# Patient Record
Sex: Male | Born: 2000 | Race: White | Hispanic: No | Marital: Single | Smoking: Never smoker
Health system: Southern US, Community
[De-identification: ages and names within clinical notes are randomized; demographics above are authoritative.]

## PROBLEM LIST (undated history)

## (undated) DIAGNOSIS — R569 Unspecified convulsions: Secondary | ICD-10-CM

## (undated) DIAGNOSIS — K219 Gastro-esophageal reflux disease without esophagitis: Secondary | ICD-10-CM

## (undated) DIAGNOSIS — G40909 Epilepsy, unspecified, not intractable, without status epilepticus: Secondary | ICD-10-CM

## (undated) HISTORY — PX: MYRINGOTOMY WITH TUBE PLACEMENT: SHX5663

---
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
## 2018-06-05 ENCOUNTER — Emergency Department (INDEPENDENT_AMBULATORY_CARE_PROVIDER_SITE_OTHER)
Admission: EM | Admit: 2018-06-05 | Discharge: 2018-06-05 | Disposition: A | Discharge status: Home / Self Care | Payer: Medicaid Other | Source: Home / Self Care | Attending: Emergency Medicine | Admitting: Emergency Medicine

## 2018-06-05 ENCOUNTER — Other Ambulatory Visit: Payer: Self-pay

## 2018-06-05 DIAGNOSIS — J069 Acute upper respiratory infection, unspecified: Secondary | ICD-10-CM | POA: Diagnosis not present

## 2018-06-05 DIAGNOSIS — J302 Other seasonal allergic rhinitis: Secondary | ICD-10-CM

## 2018-06-05 DIAGNOSIS — B9789 Other viral agents as the cause of diseases classified elsewhere: Secondary | ICD-10-CM | POA: Diagnosis not present

## 2018-06-05 MED ORDER — AMOXICILLIN 875 MG PO TABS: ORAL_TABLET | ORAL | 0 refills | Status: DC

## 2018-06-05 NOTE — ED Provider Notes (Signed)
[facility]    CSN: [id number] Arrival date & time: 06/05/18  1048     History   Chief Complaint Chief Complaint  Patient presents with  . Cough  . Nasal Congestion    HPI Bradley Carter is a 17 y.o. male.   HPI 5 days ago started with cold symptoms, started with runny nose and scratchy throat. Has been taking OTC Benadryl at night which helped somewhat heard some greenish yellow tint to nasal drainage. Occasional mild cough, occasional reductive of scant light yellow sputum. No chest pain or wheezing or shortness of breath. Denies sneezing or itchy watery eyes.  Has mild nasal congestion.  No chills/sweats No fever  No sinus pain/pressure No wheezing No chest congestion No hemoptysis No shortness of breath No pleuritic pain  No itchy/red eyes No earache  No nausea No vomiting No abdominal pain No diarrhea  No skin rashes + Minimal fatigue.  But he feels that is improving the past couple days No myalgias No headache No recent seizures associated with this illness  Past Medical History:  Diagnosis Date  . Epilepsy (HCC)   . Seizures [facility])     Patient Active Problem List   Diagnosis Date Noted  . ALLERGIC CONJUNCTIVITIS 11/03/2010  . ALLERGIC RHINITIS 11/03/2010    History reviewed. No pertinent surgical history.     Home Medications    Prior to Admission medications   Medication Sig Start Date End Date Taking? Authorizing Provider                fluticasone (FLONASE) 50 MCG/ACT nasal spray Place 2 sprays into both nostrils daily. 10/19/16   Lurene Shadow, PA-C  fluticasone Aleda Grana) 50 MCG/ACT nasal spray 1 or 2 sprays each nostril twice a day 11/29/17   Lajean Manes, MD  ibuprofen (ADVIL,MOTRIN) 200 MG tablet Take 200 mg by mouth every 6 (six) hours as needed.    [provider]   Family History Family History  Problem Relation Age of Onset  . Hypertension Father     Social History Social History   Tobacco Use  .  Smoking status: Never Smoker  . Smokeless tobacco: Never Used  Substance Use Topics  . Alcohol use: No  . Drug use: No     Allergies   Patient has no known allergies.   Review of Systems Review of Systems Pertinent items noted in HPI and remainder of comprehensive ROS otherwise negative.   Physical Exam Triage Vital Signs ED Triage Vitals  Enc Vitals Group     BP 06/05/18 1102 (!) 138/96     Pulse Rate 06/05/18 1102 (!) 108     Resp --      Temp 06/05/18 1102 (!) 97.5 F (36.4 C)     Temp Source 06/05/18 1102 Oral     SpO2 06/05/18 1102 99 %     Weight 06/05/18 1104 166 lb (75.3 kg)     Height 06/05/18 1104 5\' 11"  (1.803 m)     Head Circumference --      Peak Flow --      Pain Score 06/05/18 1103 0     Pain Loc --      Pain Edu? --      Excl. in GC? --    No data found.  Updated Vital Signs BP (!) 138/96 (BP Location: Right Arm)   Pulse (!) 108   Temp (!) 97.5 F (36.4 C) (Oral)   Ht 5\' 11"  (1.803 m)  Wt 75.3 kg   SpO2 99%   BMI 23.15 kg/m   Visual Acuity Right Eye Distance:   Left Eye Distance:   Bilateral Distance:    Right Eye Near:   Left Eye Near:    Bilateral Near:     Physical Exam  Constitutional: He is oriented to person, place, and time. He appears well-developed and well-nourished. No distress.  HENT:  Head: Normocephalic and atraumatic.  Right Ear: Tympanic membrane, external ear and ear canal normal.  Left Ear: Tympanic membrane, external ear and ear canal normal.  Nose: Mucosal edema and rhinorrhea present. Right sinus exhibits no maxillary sinus tenderness. Left sinus exhibits no maxillary sinus tenderness.  Mouth/Throat: Oropharynx is clear and moist. No oral lesions. No oropharyngeal exudate.  Eyes: Right eye exhibits no discharge. Left eye exhibits no discharge. No scleral icterus.  Neck: Neck supple.  Cardiovascular: Normal rate, regular rhythm and normal heart sounds.  Pulmonary/Chest: Effort normal and breath sounds normal.  He has no wheezes. He has no rales.  Lymphadenopathy:    He has no cervical adenopathy.  Neurological: He is alert and oriented to person, place, and time.  Skin: Skin is warm and dry.  Nursing note and vitals reviewed.    UC Treatments / Results  Labs (all labs ordered are listed, but only abnormal results are displayed) Labs Reviewed - No data to display  EKG None  Radiology No results found.  Procedures Procedures (including critical care time)  Medications Ordered in UC Medications - No data to display  Initial Impression / Assessment and Plan / UC Course  I have reviewed the triage vital signs and the nursing notes.  Pertinent labs & imaging results that were available during my care of the patient were reviewed by me and considered in my medical decision making (see chart for details).      Final Clinical Impressions(s) / UC Diagnoses   Final diagnoses:  Viral URI with cough  Seasonal allergies  No evidence of bacterial infection. Discussed at length with patient and father. They agree with the following plans:   Discharge Instructions     You likely have a combination of seasonal allergies and viral upper respiratory infection. If you develop bacterial symptoms, such as worsening fever or chills or worsening discolored nasal drainage and chest congestion, then fill prescription for amoxicillin. Otherwise, please read attached instruction sheets.  Push fluids. Can use OTC Flonase 1 spray each nostril twice a day. Try OTC Zyrtec, 1 daily for allergies. Follow-up with your PCP if no better 1 week, sooner if worse or new symptoms.   ED Prescriptions    Medication Sig Dispense Auth. Provider   amoxicillin (AMOXIL) 875 MG tablet Take 1 twice a day X 10 days. 20 tablet Lajean Manes, MD        Lajean Manes, MD 06/05/18 1500

## 2018-06-05 NOTE — Discharge Instructions (Addendum)
You likely have a combination of seasonal allergies and viral upper respiratory infection. If you develop bacterial symptoms, such as worsening fever or chills or worsening discolored nasal drainage and chest congestion, then fill prescription for amoxicillin. Otherwise, please read attached instruction sheets.  Push fluids. Can use OTC Flonase 1 spray each nostril twice a day. Try OTC Zyrtec, 1 daily for allergies. Follow-up with your PCP if no better 1 week, sooner if worse or new symptoms.

## 2018-06-05 NOTE — ED Triage Notes (Signed)
Pt c/o cold sxs since last Friday. Started out as runny nose and scratchy throat. Has been taking an allergy pill at night. Some greenish/yellow tint to his drainage.

## 2018-07-05 ENCOUNTER — Other Ambulatory Visit: Payer: Self-pay

## 2018-07-05 ENCOUNTER — Emergency Department (INDEPENDENT_AMBULATORY_CARE_PROVIDER_SITE_OTHER)
Admission: EM | Admit: 2018-07-05 | Discharge: 2018-07-05 | Disposition: A | Discharge status: Home / Self Care | Payer: Medicaid Other | Source: Home / Self Care | Attending: Family Medicine | Admitting: Family Medicine

## 2018-07-05 ENCOUNTER — Encounter: Payer: Self-pay | Admitting: *Deleted

## 2018-07-05 DIAGNOSIS — Z8719 Personal history of other diseases of the digestive system: Secondary | ICD-10-CM

## 2018-07-05 DIAGNOSIS — R197 Diarrhea, unspecified: Secondary | ICD-10-CM

## 2018-07-05 DIAGNOSIS — R112 Nausea with vomiting, unspecified: Secondary | ICD-10-CM | POA: Diagnosis not present

## 2018-07-05 DIAGNOSIS — F439 Reaction to severe stress, unspecified: Secondary | ICD-10-CM

## 2018-07-05 DIAGNOSIS — R109 Unspecified abdominal pain: Secondary | ICD-10-CM

## 2018-07-05 HISTORY — DX: Gastro-esophageal reflux disease without esophagitis: K21.9

## 2018-07-05 MED ORDER — ONDANSETRON 4 MG PO TBDP: 4.0000 mg | ORAL_TABLET | Freq: Three times a day (TID) | ORAL | 0 refills | Status: DC | PRN

## 2018-07-05 MED ORDER — OMEPRAZOLE 20 MG PO CPDR: 20.0000 mg | DELAYED_RELEASE_CAPSULE | Freq: Every day | ORAL | 0 refills | Status: DC

## 2018-07-05 MED ORDER — CETIRIZINE HCL 10 MG PO TABS: 10.0000 mg | ORAL_TABLET | Freq: Every day | ORAL | 1 refills | Status: AC

## 2018-07-05 NOTE — ED Provider Notes (Addendum)
[name]    CSN: [id number] Arrival date & time: 07/05/18  0948     History   Chief Complaint Chief Complaint  Patient presents with  . Diarrhea  . Emesis    HPI Benn Tarver is a 17 y.o. male.   HPI Rahmel Nedved is a 17 y.o. male presenting to UC with mother c/o 3 days of umbilical and epigastric abdominal cramping, associated 2 episodes of vomiting the first day, nausea, and two episodes of diarrhea over the last 24 hours. Pt believes it could be due to stress at home and school.  Mother agrees as pt's grandmother is in the hospital but she also notes pt had a hx of severe GERD when he was born.  Pt has had decreased appetite due to the nausea. Denies fever, chills, or urinary symptoms. No hx of abdominal surgeries.  No sick contacts or recent travel.    Past Medical History:  Diagnosis Date  . Epilepsy (HCC)   . GERD (gastroesophageal reflux disease)    as an infant and child  . Seizures (HCC)    as child- resolved per pt's mother    Patient Active Problem List   Diagnosis Date Noted  . ALLERGIC CONJUNCTIVITIS 11/03/2010  . ALLERGIC RHINITIS 11/03/2010    Past Surgical History:  Procedure Laterality Date  . MYRINGOTOMY WITH TUBE PLACEMENT         Home Medications    Prior to Admission medications   Medication Sig Start Date End Date Taking? Authorizing Provider  cetirizine (ZYRTEC) 10 MG tablet Take 1 tablet (10 mg total) by mouth daily. 07/05/18   Lurene Shadow, PA-C  omeprazole (PRILOSEC) 20 MG capsule Take 1 capsule (20 mg total) by mouth daily. 07/05/18   Lurene Shadow, PA-C  ondansetron (ZOFRAN ODT) 4 MG disintegrating tablet Take 1 tablet (4 mg total) by mouth every 8 (eight) hours as needed. 07/05/18   Lurene Shadow, PA-C    Family History Family History  Problem Relation Age of Onset  . Hypertension Father     Social History Social History   Tobacco Use  . Smoking status: Never Smoker  . Smokeless tobacco: Never Used    Substance Use Topics  . Alcohol use: No  . Drug use: No     Allergies   Patient has no known allergies.   Review of Systems Review of Systems  Constitutional: Negative for chills and fever.  Cardiovascular: Negative for chest pain and palpitations.  Gastrointestinal: Positive for abdominal pain, diarrhea, nausea and vomiting. Negative for blood in stool.  Genitourinary: Negative for dysuria, frequency and hematuria.  Musculoskeletal: Negative for back pain and myalgias.  Neurological: Negative for dizziness, light-headedness and headaches.     Physical Exam Triage Vital Signs ED Triage Vitals  Enc Vitals Group     BP 07/05/18 1007 135/89     Pulse Rate 07/05/18 1007 (!) 102     Resp 07/05/18 1007 18     Temp 07/05/18 1007 (!) 97.3 F (36.3 C)     Temp src --      SpO2 07/05/18 1007 97 %     Weight 07/05/18 1008 161 lb (73 kg)     Height 07/05/18 1008 5\' 11"  (1.803 m)     Head Circumference --      Peak Flow --      Pain Score 07/05/18 1007 3     Pain Loc --      Pain Edu? --  Excl. in GC? --    No data found.  Updated Vital Signs BP 135/89 (BP Location: Right Arm)   Pulse (!) 102   Temp (!) 97.3 F (36.3 C)   Resp 18   Ht 5\' 11"  (1.803 m)   Wt 161 lb (73 kg)   SpO2 97%   BMI 22.45 kg/m   Visual Acuity Right Eye Distance:   Left Eye Distance:   Bilateral Distance:    Right Eye Near:   Left Eye Near:    Bilateral Near:     Physical Exam  Constitutional: He is oriented to person, place, and time. He appears well-developed and well-nourished. No distress.  HENT:  Head: Normocephalic and atraumatic.  Right Ear: Tympanic membrane normal.  Left Ear: Tympanic membrane normal.  Nose: Nose normal.  Mouth/Throat: Uvula is midline, oropharynx is clear and moist and mucous membranes are normal.  Eyes: EOM are normal.  Neck: Normal range of motion. Neck supple.  Cardiovascular: Normal rate and regular rhythm.  Pulmonary/Chest: Effort normal and  breath sounds normal. No stridor. No respiratory distress. He has no wheezes. He has no rales.  Abdominal: Soft. Bowel sounds are normal. There is tenderness in the epigastric area and periumbilical area. There is no rigidity, no guarding and no CVA tenderness.  Musculoskeletal: Normal range of motion.  Neurological: He is alert and oriented to person, place, and time.  Skin: Skin is warm and dry. He is not diaphoretic.  Psychiatric: He has a normal mood and affect. His behavior is normal.  Nursing note and vitals reviewed.    UC Treatments / Results  Labs (all labs ordered are listed, but only abnormal results are displayed) Labs Reviewed - No data to display  EKG None  Radiology No results found.  Procedures Procedures (including critical care time)  Medications Ordered in UC Medications - No data to display  Initial Impression / Assessment and Plan / UC Course  I have reviewed the triage vital signs and the nursing notes.  Pertinent labs & imaging results that were available during my care of the patient were reviewed by me and considered in my medical decision making (see chart for details).     No evidence of acute abdomen. Doubt appendicitis or cholecystitis at this time. Likely viral vs stress/GERD Encouraged conservative tx at this time. Home instructions provided. Pt and mother agreeable with tx plan.  Mother also requested refill on Zyrtec for pt's hx of seasonal allergies as it is cheaper prescription for pt than OTC.  Final Clinical Impressions(s) / UC Diagnoses   Final diagnoses:  Nausea vomiting and diarrhea  Stress  Abdominal cramping  Hx of gastroesophageal reflux (GERD)     Discharge Instructions      Be sure to get a lot of rest and stay well hydrated with sports drinks, water, diluted juices, and clear sodas.  Avoid fried fatty food, spicy food, and milk as these foods can cause worsening stomach upset.   Please follow up with family  medicine early next week if not improving.     ED Prescriptions    Medication Sig Dispense Auth. Provider   ondansetron (ZOFRAN ODT) 4 MG disintegrating tablet Take 1 tablet (4 mg total) by mouth every 8 (eight) hours as needed. 12 tablet Doroteo Glassman, Ryver Poblete O, PA-C   cetirizine (ZYRTEC) 10 MG tablet Take 1 tablet (10 mg total) by mouth daily. 30 tablet Lurene Shadow, PA-C   omeprazole (PRILOSEC) 20 MG capsule Take 1 capsule (20  mg total) by mouth daily. 30 capsule Lurene ShadowPhelps, Aarianna Hoadley O, PA-C     Controlled Substance Prescriptions [place] Controlled Substance Registry consulted? Not Applicable   Rolla Platehelps, Lyndel Dancel O, PA-C 07/05/18 1542    Lurene ShadowPhelps, Lief Palmatier O, [place]PA-C 07/05/18 [phone]

## 2018-07-05 NOTE — ED Triage Notes (Signed)
Pt c/o diarrhea x 2, vomiting x 1 and nausea x 3 days with umbilical area pain. Denies fever. He has taken Pepto pills. His mother reports a hx of GERD since birth, but no issues since childhood.

## 2018-07-05 NOTE — Discharge Instructions (Signed)
°  Be sure to get a lot of rest and stay well hydrated with sports drinks, water, diluted juices, and clear sodas.  Avoid fried fatty food, spicy food, and milk as these foods can cause worsening stomach upset.   Please follow up with family medicine early next week if not improving.

## 2018-07-07 ENCOUNTER — Telehealth: Payer: Self-pay | Admitting: Emergency Medicine

## 2018-07-07 NOTE — Telephone Encounter (Signed)
Attempted to contact patient, mailbox is full unable to leave a message.

## 2020-08-21 HISTORY — PX: WISDOM TOOTH EXTRACTION: SHX21

## 2020-08-23 ENCOUNTER — Encounter: Payer: Self-pay | Admitting: Emergency Medicine

## 2020-08-23 ENCOUNTER — Emergency Department (INDEPENDENT_AMBULATORY_CARE_PROVIDER_SITE_OTHER)
Admission: EM | Admit: 2020-08-23 | Discharge: 2020-08-23 | Disposition: A | Discharge status: Home / Self Care | Payer: Medicaid Other | Source: Home / Self Care

## 2020-08-23 ENCOUNTER — Other Ambulatory Visit: Payer: Self-pay

## 2020-08-23 DIAGNOSIS — H6692 Otitis media, unspecified, left ear: Secondary | ICD-10-CM | POA: Diagnosis not present

## 2020-08-23 DIAGNOSIS — Z20822 Contact with and (suspected) exposure to covid-19: Secondary | ICD-10-CM

## 2020-08-23 DIAGNOSIS — J069 Acute upper respiratory infection, unspecified: Secondary | ICD-10-CM

## 2020-08-23 MED ORDER — AMOXICILLIN-POT CLAVULANATE 875-125 MG PO TABS: 1.0000 | ORAL_TABLET | Freq: Two times a day (BID) | ORAL | 0 refills | Status: DC

## 2020-08-23 NOTE — Discharge Instructions (Addendum)

## 2020-08-23 NOTE — ED Provider Notes (Signed)
[facility]    CSN: [id number] Arrival date & time: 08/23/20  1627      History   Chief Complaint Chief Complaint  Patient presents with  . Nasal Congestion    HPI Bradley Carter is a 20 y.o. male.   HPI Bradley Carter is a 20 y.o. male presenting to UC with c/o nasal congestion, scratchy throat and minimal cough that started about 6 days ago, after being exposed to someone positive with COVID a week ago. Associated bilateral ear fullness.  Denies fever, chills, n/v/d. He has had 2 doses of Pifzer COVID vaccine but not the booster. He has not had the flu vaccine.    Past Medical History:  Diagnosis Date  . Epilepsy ([place])   . GERD (gastroesophageal reflux disease)    as an infant and child  . Seizures ([place])    as child- resolved per pt's mother    Patient Active Problem List   Diagnosis Date Noted  . ALLERGIC CONJUNCTIVITIS 11/03/2010  . ALLERGIC RHINITIS 11/03/2010    Past Surgical History:  Procedure Laterality Date  . MYRINGOTOMY WITH TUBE PLACEMENT         Home Medications    Prior to Admission medications   Medication Sig Start Date End Date Taking? Authorizing Provider  amoxicillin-clavulanate (AUGMENTIN) 875-125 MG tablet Take 1 tablet by mouth 2 (two) times daily. One po bid x 7 days 08/23/20  Yes Nancy Manuele O, PA-C  cetirizine (ZYRTEC) 10 MG tablet Take 1 tablet (10 mg total) by mouth daily. 07/05/18   Noe Gens, PA-C  omeprazole (PRILOSEC) 20 MG capsule Take 1 capsule (20 mg total) by mouth daily. 07/05/18   Noe Gens, PA-C  ondansetron (ZOFRAN ODT) 4 MG disintegrating tablet Take 1 tablet (4 mg total) by mouth every 8 (eight) hours as needed. 07/05/18   Noe Gens, PA-C    Family History Family History  Problem Relation Age of Onset  . Hypertension Father     Social History Social History   Tobacco Use  . Smoking status: Never Smoker  . Smokeless tobacco: Never Used  Vaping Use  . Vaping Use: Never used  Substance  Use Topics  . Alcohol use: No  . Drug use: No     Allergies   Patient has no known allergies.   Review of Systems Review of Systems  Constitutional: Negative for chills and fever.  HENT: Positive for congestion and sore throat. Negative for ear pain, trouble swallowing and voice change.   Respiratory: Positive for cough. Negative for shortness of breath.   Cardiovascular: Negative for chest pain and palpitations.  Gastrointestinal: Negative for abdominal pain, diarrhea, nausea and vomiting.  Musculoskeletal: Negative for arthralgias, back pain and myalgias.  Skin: Negative for rash.  All other systems reviewed and are negative.    Physical Exam Triage Vital Signs ED Triage Vitals  Enc Vitals Group     BP 08/23/20 1729 129/87     Pulse Rate 08/23/20 1729 74     Resp 08/23/20 1729 16     Temp 08/23/20 1729 98.5 F (36.9 C)     Temp Source 08/23/20 1729 Oral     SpO2 08/23/20 1729 100 %     Weight 08/23/20 1731 175 lb (79.4 kg)     Height 08/23/20 1731 5\' 11"  (1.803 m)     Head Circumference --      Peak Flow --      Pain Score 08/23/20 1730 0  Pain Loc --      Pain Edu? --      Excl. in GC? --    No data found.  Updated Vital Signs BP 129/87 (BP Location: Right Arm)   Pulse 74   Temp 98.5 F (36.9 C) (Oral)   Resp 16   Ht 5\' 11"  (1.803 m)   Wt 175 lb (79.4 kg)   SpO2 100%   BMI 24.41 kg/m   Visual Acuity Right Eye Distance:   Left Eye Distance:   Bilateral Distance:    Right Eye Near:   Left Eye Near:    Bilateral Near:     Physical Exam Vitals and nursing note reviewed.  Constitutional:      General: He is not in acute distress.    Appearance: Normal appearance. He is well-developed and well-nourished. He is not ill-appearing, toxic-appearing or diaphoretic.  HENT:     Head: Normocephalic and atraumatic.     Right Ear: Tympanic membrane and ear canal normal.     Left Ear: Ear canal normal. A middle ear effusion is present. Tympanic membrane  is erythematous and bulging.     Nose: Congestion present.     Right Sinus: No maxillary sinus tenderness or frontal sinus tenderness.     Left Sinus: No maxillary sinus tenderness or frontal sinus tenderness.     Mouth/Throat:     Lips: Pink.     Mouth: Mucous membranes are moist.     Pharynx: Oropharynx is clear. Uvula midline. No pharyngeal swelling, oropharyngeal exudate, posterior oropharyngeal erythema or uvula swelling.  Eyes:     Extraocular Movements: EOM normal.  Cardiovascular:     Rate and Rhythm: Normal rate and regular rhythm.  Pulmonary:     Effort: Pulmonary effort is normal. No respiratory distress.     Breath sounds: Normal breath sounds. No stridor. No wheezing, rhonchi or rales.  Musculoskeletal:        General: Normal range of motion.     Cervical back: Normal range of motion and neck supple. No tenderness.  Lymphadenopathy:     Cervical: Cervical adenopathy present.  Skin:    General: Skin is warm and dry.  Neurological:     Mental Status: He is alert and oriented to person, place, and time.  Psychiatric:        Mood and Affect: Mood and affect normal.        Behavior: Behavior normal.      UC Treatments / Results  Labs (all labs ordered are listed, but only abnormal results are displayed) Labs Reviewed  COVID-19, FLU A+B NAA    EKG   Radiology No results found.  Procedures Procedures (including critical care time)  Medications Ordered in UC Medications - No data to display  Initial Impression / Assessment and Plan / UC Course  I have reviewed the triage vital signs and the nursing notes.  Pertinent labs & imaging results that were available during my care of the patient were reviewed by me and considered in my medical decision making (see chart for details).     Hx and exam c/w Left AOM secondary to URI COVID/Flu test pending Rx: augmenting F/u with PCP as needed  Final Clinical Impressions(s) / UC Diagnoses   Final diagnoses:   Acute upper respiratory infection  Exposure to COVID-19 virus  Left acute otitis media     Discharge Instructions      Please take antibiotics as prescribed and be sure to complete entire course  even if you start to feel better to ensure infection does not come back.   You may take 500mg  acetaminophen every 4-6 hours or in combination with ibuprofen 400-600mg  every 6-8 hours as needed for pain, inflammation, and fever.  Be sure to well hydrated with clear liquids and get at least 8 hours of sleep at night, preferably more while sick.   Please follow up with family medicine in 1 week if needed.     ED Prescriptions    Medication Sig Dispense Auth. Provider   amoxicillin-clavulanate (AUGMENTIN) 875-125 MG tablet Take 1 tablet by mouth 2 (two) times daily. One po bid x 7 days 14 tablet , Lurene Shadow     PDMP not reviewed this encounter.   [place], Lurene Shadow 08/23/20 1910

## 2020-08-23 NOTE — ED Triage Notes (Signed)
Patient here day 6 after exposure to covid positive person; has had some congestion and takes zyrtec. Had first 2 doses Pfizer vaccine; not booster yet; no influenza vaccination. Last OTC around noon today.

## 2020-08-25 LAB — COVID-19, FLU A+B NAA
Influenza A, NAA: NOT DETECTED
Influenza B, NAA: NOT DETECTED
SARS-CoV-2, NAA: NOT DETECTED

## 2020-08-26 ENCOUNTER — Telehealth: Payer: Self-pay

## 2020-08-26 NOTE — Telephone Encounter (Signed)
Negative flu and covid test results discussed with patient. Questions asked and answers provided.

## 2021-07-08 ENCOUNTER — Other Ambulatory Visit: Payer: Self-pay

## 2021-07-08 ENCOUNTER — Emergency Department (INDEPENDENT_AMBULATORY_CARE_PROVIDER_SITE_OTHER)
Admission: EM | Admit: 2021-07-08 | Discharge: 2021-07-08 | Disposition: A | Discharge status: Home / Self Care | Payer: Medicaid Other | Source: Home / Self Care

## 2021-07-08 DIAGNOSIS — J0100 Acute maxillary sinusitis, unspecified: Secondary | ICD-10-CM

## 2021-07-08 DIAGNOSIS — J309 Allergic rhinitis, unspecified: Secondary | ICD-10-CM

## 2021-07-08 DIAGNOSIS — R52 Pain, unspecified: Secondary | ICD-10-CM

## 2021-07-08 MED ORDER — CEFDINIR 300 MG PO CAPS
300.0000 mg | ORAL_CAPSULE | Freq: Two times a day (BID) | ORAL | 0 refills | Status: AC
Start: 2021-07-08 — End: 2021-07-15

## 2021-07-08 MED ORDER — FEXOFENADINE HCL 180 MG PO TABS: 180.0000 mg | ORAL_TABLET | Freq: Every day | ORAL | 0 refills | Status: AC

## 2021-07-08 NOTE — ED Triage Notes (Signed)
Pt states that he has some vomiting, body aches and headache. X1week  Pt states that he is vaccinated.  Pt states that he hasn't had flu vaccine.

## 2021-07-08 NOTE — Discharge Instructions (Addendum)
Advised/instructed patient to take medication as directed with food to completion.  Advised patient to discontinue Zyrtec and take Allegra with first dose of antibiotic for the next 5 of 7 days.  Advised may use Allegra afterwards as needed for concurrent postnasal drainage/drip.  Advised patient we will follow-up with COVID-19 flu A/B results once received.  Encouraged patient to increase daily water intake while taking these medications.

## 2021-07-08 NOTE — ED Provider Notes (Signed)
[name]    CSN: [id number] Arrival date & time: 07/08/21  1316      History   Chief Complaint Chief Complaint  Patient presents with   Vomiting    Pt states that he has some vomiting, body aches, and headache. X1 week    HPI Lorrie Strauch is a 20 y.o. male.   HPI 20 year old male presents with body aches, some vomiting and headache for 1 week.  Past Medical History:  Diagnosis Date   Epilepsy ([facility])    GERD (gastroesophageal reflux disease)    as an infant and child   Seizures ([facility])    as child- resolved per pt's mother    Patient Active Problem List   Diagnosis Date Noted   ALLERGIC CONJUNCTIVITIS 11/03/2010   ALLERGIC RHINITIS 11/03/2010    Past Surgical History:  Procedure Laterality Date   MYRINGOTOMY WITH TUBE PLACEMENT         Home Medications    Prior to Admission medications   Medication Sig Start Date End Date Taking? Authorizing Provider  amoxicillin-clavulanate (AUGMENTIN) 875-125 MG tablet Take 1 tablet by mouth 2 (two) times daily. One po bid x 7 days 08/23/20   Lurene Shadow, PA-C  cetirizine (ZYRTEC) 10 MG tablet Take 1 tablet (10 mg total) by mouth daily. 07/05/18   Lurene Shadow, PA-C  omeprazole (PRILOSEC) 20 MG capsule Take 1 capsule (20 mg total) by mouth daily. 07/05/18   Lurene Shadow, PA-C  ondansetron (ZOFRAN ODT) 4 MG disintegrating tablet Take 1 tablet (4 mg total) by mouth every 8 (eight) hours as needed. 07/05/18   Lurene Shadow, PA-C    Family History Family History  Problem Relation Age of Onset   Hypertension Father     Social History Social History   Tobacco Use   Smoking status: Never   Smokeless tobacco: Never  Vaping Use   Vaping Use: Never used  Substance Use Topics   Alcohol use: No   Drug use: No     Allergies   Patient has no known allergies.   Review of Systems Review of Systems  Constitutional:  Positive for fatigue and fever.  Gastrointestinal:  Positive for vomiting.   Musculoskeletal:  Positive for myalgias.  Neurological:  Positive for headaches.  All other systems reviewed and are negative.   Physical Exam Triage Vital Signs ED Triage Vitals  Enc Vitals Group     BP 07/08/21 1353 129/84     Pulse Rate 07/08/21 1353 (!) 107     Resp 07/08/21 1353 20     Temp 07/08/21 1353 (!) 97.5 F (36.4 C)     Temp Source 07/08/21 1353 Oral     SpO2 07/08/21 1353 96 %     Weight 07/08/21 1351 180 lb (81.6 kg)     Height 07/08/21 1351 5\' 11"  (1.803 m)     Head Circumference --      Peak Flow --      Pain Score 07/08/21 1350 7     Pain Loc --      Pain Edu? --      Excl. in GC? --    No data found.  Updated Vital Signs BP 129/84 (BP Location: Right Arm)   Pulse (!) 107   Temp (!) 97.5 F (36.4 C) (Oral)   Resp 20   Ht 5\' 11"  (1.803 m)   Wt 180 lb (81.6 kg)   SpO2 96%   BMI 25.10 kg/m  Physical Exam Vitals and nursing note reviewed.  Constitutional:      General: He is not in acute distress.    Appearance: Normal appearance. He is normal weight. He is not ill-appearing.  HENT:     Head: Normocephalic and atraumatic.     Right Ear: Tympanic membrane, ear canal and external ear normal.     Left Ear: Tympanic membrane, ear canal and external ear normal.     Nose: Nose normal.     Mouth/Throat:     Mouth: Mucous membranes are moist.     Pharynx: Oropharynx is clear.  Eyes:     Extraocular Movements: Extraocular movements intact.     Conjunctiva/sclera: Conjunctivae normal.     Pupils: Pupils are equal, round, and reactive to light.  Cardiovascular:     Rate and Rhythm: Normal rate and regular rhythm.     Pulses: Normal pulses.     Heart sounds: Normal heart sounds.  Pulmonary:     Effort: Pulmonary effort is normal.     Breath sounds: Normal breath sounds.  Musculoskeletal:        General: Normal range of motion.     Cervical back: Normal range of motion and neck supple.  Skin:    General: Skin is warm and dry.  Neurological:      General: No focal deficit present.     Mental Status: He is alert and oriented to person, place, and time.     UC Treatments / Results  Labs (all labs ordered are listed, but only abnormal results are displayed) Labs Reviewed  COVID-19, FLU A+B NAA    EKG   Radiology No results found.  Procedures Procedures (including critical care time)  Medications Ordered in UC Medications - No data to display  Initial Impression / Assessment and Plan / UC Course  I have reviewed the triage vital signs and the nursing notes.  Pertinent labs & imaging results that were available during my care of the patient were reviewed by me and considered in my medical decision making (see chart for details).     MDM: 1. Subacute maxillary sinusitis-Rx'd Cefdinir; 2.  Allergic rhinitis-Rx'd Allegra; 3.  Generalized body aches-COVID-19 flu AB ordered. Advised/instructed patient to take medication as directed with food to completion.  Advised patient to discontinue Zyrtec and take Allegra with first dose of antibiotic for the next 5 of 7 days.  Advised may use Allegra afterwards as needed for concurrent postnasal drainage/drip.  Advised patient we will follow-up with COVID-19 flu A/B results once received.  Encouraged patient to increase daily water intake while taking these medications.  Patient discharged home, hemodynamically stable. Final Clinical Impressions(s) / UC Diagnoses   Final diagnoses:  Subacute maxillary sinusitis  Allergic rhinitis, unspecified seasonality, unspecified trigger  Generalized body aches     Discharge Instructions      Advised/instructed patient to take medication as directed with food to completion.  Advised patient to discontinue Zyrtec and take Allegra with first dose of antibiotic for the next 5 of 7 days.  Advised may use Allegra afterwards as needed for concurrent postnasal drainage/drip.  Advised patient we will follow-up with COVID-19 flu A/B results once  received.  Encouraged patient to increase daily water intake while taking these medications.     ED Prescriptions   None    PDMP not reviewed this encounter.   Trevor Iha, FNP 07/08/21 1513

## 2021-07-09 LAB — COVID-19, FLU A+B NAA
Influenza A, NAA: NOT DETECTED
Influenza B, NAA: NOT DETECTED
SARS-CoV-2, NAA: NOT DETECTED

## 2021-12-17 ENCOUNTER — Encounter: Payer: Self-pay | Admitting: Emergency Medicine

## 2021-12-17 ENCOUNTER — Emergency Department
Admission: EM | Admit: 2021-12-17 | Discharge: 2021-12-17 | Disposition: A | Discharge status: Home / Self Care | Payer: Medicaid Other | Source: Home / Self Care | Attending: Family Medicine | Admitting: Family Medicine

## 2021-12-17 DIAGNOSIS — J029 Acute pharyngitis, unspecified: Secondary | ICD-10-CM

## 2021-12-17 DIAGNOSIS — K140 Glossitis: Secondary | ICD-10-CM

## 2021-12-17 LAB — POCT RAPID STREP A (OFFICE): Rapid Strep A Screen: NEGATIVE

## 2021-12-17 LAB — POC SARS CORONAVIRUS 2 AG -  ED: SARS Coronavirus 2 Ag: NEGATIVE

## 2021-12-17 MED ORDER — ACETAMINOPHEN 325 MG PO TABS
650.0000 mg | ORAL_TABLET | Freq: Once | ORAL | Status: AC
  Administered 2021-12-17: 650 mg via ORAL

## 2021-12-17 NOTE — ED Provider Notes (Signed)
?[facility] ? ? ? ?CSN: [id number] ?Arrival date & time: 12/17/21  1326 ? ? ?  ? ?History   ?Chief Complaint ?Chief Complaint  ?Patient presents with  ? Sore Throat  ? ? ?HPI ?Bradley Carter is a 21 y.o. male.  ? ?HPI ? ?Patient's had some muscle pain in his back since Tuesday.  He points to the area of his shoulder blade, mid back region.  He did not have any accident or injury, no overuse, he thinks he "slept wrong".  This morning he woke up with a red irritated tongue, sore throat, and fever.  No known exposure to COVID or influenza, or strep.  He is here for evaluation. ?He is generally in good health.  On no medications.  Had seizures as a child but none since he is an adult ?He does have environmental allergies and takes as needed over-the-counter medicine ? ?Past Medical History:  ?Diagnosis Date  ? Epilepsy ([facility])   ? GERD (gastroesophageal reflux disease)   ? as an infant and child  ? Seizures ([facility])   ? as child- resolved per pt's mother  ? ? ?Patient Active Problem List  ? Diagnosis Date Noted  ? ALLERGIC CONJUNCTIVITIS 11/03/2010  ? ALLERGIC RHINITIS 11/03/2010  ? ? ?Past Surgical History:  ?Procedure Laterality Date  ? MYRINGOTOMY WITH TUBE PLACEMENT    ? WISDOM TOOTH EXTRACTION  2022  ? ? ? ? ? ?Home Medications   ? ?Prior to Admission medications   ?Medication Sig Start Date End Date Taking? Authorizing Provider  ?cetirizine (ZYRTEC) 10 MG tablet Take 1 tablet (10 mg total) by mouth daily. 07/05/18   Lurene ShadowPhelps, Erin O, PA-C  ?fexofenadine (ALLEGRA ALLERGY) 180 MG tablet Take 1 tablet (180 mg total) by mouth daily for 15 days. 07/08/21 07/23/21  Trevor Ihaagan, Michael, FNP  ? ? ?Family History ?Family History  ?Problem Relation Age of Onset  ? Healthy Mother   ? Hypertension Father   ? ? ?Social History ?Social History  ? ?Tobacco Use  ? Smoking status: Never  ? Smokeless tobacco: Never  ?Vaping Use  ? Vaping Use: Never used  ?Substance Use Topics  ? Alcohol use: No  ? Drug use: No  ? ? ? ?Allergies    ?Penicillins ? ? ?Review of Systems ?Review of Systems ?See HPI ? ?Physical Exam ?Triage Vital Signs ?ED Triage Vitals  ?Enc Vitals Group  ?   BP 12/17/21 1343 128/80  ?   Pulse Rate 12/17/21 1343 (!) 120  ?   Resp 12/17/21 1343 17  ?   Temp 12/17/21 1343 (!) 101.4 ?F (38.6 ?C)  ?   Temp Source 12/17/21 1343 Oral  ?   SpO2 12/17/21 1343 97 %  ?   Weight 12/17/21 1345 175 lb (79.4 kg)  ?   Height 12/17/21 1345 5\' 11"  (1.803 m)  ?   Head Circumference --   ?   Peak Flow --   ?   Pain Score 12/17/21 1344 6  ?   Pain Loc --   ?   Pain Edu? --   ?   Excl. in GC? --   ? ?No data found. ? ?Updated Vital Signs ?BP 128/80 (BP Location: Left Arm)   Pulse (!) 120   Temp (!) 101 ?F (38.3 ?C) (Oral)   Resp 17   Ht 5\' 11"  (1.803 m)   Wt 79.4 kg   SpO2 97%   BMI 24.41 kg/m?  ? ? ?Physical Exam ?Constitutional:   ?  General: He is not in acute distress. ?   Appearance: He is well-developed and normal weight. He is ill-appearing.  ?HENT:  ?   Head: Normocephalic and atraumatic.  ?   Right Ear: Tympanic membrane and ear canal normal.  ?   Left Ear: Tympanic membrane and ear canal normal.  ?   Nose: No congestion.  ?   Mouth/Throat:  ?   Mouth: Mucous membranes are moist. Oral lesions present.  ?   Pharynx: Posterior oropharyngeal erythema present.  ?   Tonsils: No tonsillar exudate. 0 on the right. 0 on the left.  ?   Comments: Patient has petechiae all around the rim of his tongue and slight erythema of the tongue body.  Posterior pharynx erythematous.  No exudate ?Eyes:  ?   Conjunctiva/sclera: Conjunctivae normal.  ?   Pupils: Pupils are equal, round, and reactive to light.  ?Cardiovascular:  ?   Rate and Rhythm: Normal rate and regular rhythm.  ?   Heart sounds: Normal heart sounds.  ?Pulmonary:  ?   Effort: Pulmonary effort is normal. No respiratory distress.  ?   Breath sounds: Normal breath sounds. No wheezing or rhonchi.  ?Abdominal:  ?   General: There is no distension.  ?   Palpations: Abdomen is soft.   ?Musculoskeletal:     ?   General: Normal range of motion.  ?   Cervical back: Normal range of motion.  ?Lymphadenopathy:  ?   Cervical: No cervical adenopathy.  ?Skin: ?   General: Skin is warm and dry.  ?   Findings: No rash.  ?Neurological:  ?   General: No focal deficit present.  ?   Mental Status: He is alert.  ? ? ? ?UC Treatments / Results  ?Labs ?(all labs ordered are listed, but only abnormal results are displayed) ?Labs Reviewed  ?CULTURE, GROUP A STREP  ?POCT RAPID STREP A (OFFICE)  ?POC SARS CORONAVIRUS 2 AG -  ED  ? ? ?EKG ? ? ?Radiology ?No results found. ? ?Procedures ?Procedures (including critical care time) ? ?Medications Ordered in UC ?Medications  ?acetaminophen (TYLENOL) tablet 650 mg (650 mg Oral Given 12/17/21 1355)  ? ? ?Initial Impression / Assessment and Plan / UC Course  ?I have reviewed the triage vital signs and the nursing notes. ? ?Pertinent labs & imaging results that were available during my care of the patient were reviewed by me and considered in my medical decision making (see chart for details). ? ?  ? ?Rapid strep test is negative ?Rapid COVID test ?Throat culture is sent ? ?Penicillin allergy discussed.  He was given penicillin and 800 mg ibuprofen after his wisdom teeth removed and had a recurrence of seizures.  His neurologist told him not to take this medicine again.  No true penicillin allergy documented. ?Final Clinical Impressions(s) / UC Diagnoses  ? ?Final diagnoses:  ?Acute pharyngitis, unspecified etiology  ?Viral pharyngitis  ?Acute glossitis  ? ? ? ?Discharge Instructions   ? ?  ?Strep rapid test is negative, throat culture will be sent to laboratory.  These results will be available in 2 to 3 days on MyChart. ?COVID test is negative ?This is a viral respiratory infection.  You need rest, lots of fluids, Tylenol or ibuprofen for pain and fever ?May return to work and school when you have had no fever for 24 hours and your symptoms have improved ?Call for  problems ? ? ? ? ?ED Prescriptions   ?None ?  ? ?  PDMP not reviewed this encounter. ?  ?Eustace Moore, MD ?12/17/21 1455 ? ?

## 2021-12-17 NOTE — ED Triage Notes (Signed)
Pt has had upper middle back pain since Tuesday  ?Denies sore throat  ?Woke up this am w/ a red, irritated tongue & sore throat ?OTC meds - Flexeril x 1 last night ?OTC - Ibuprofen during the week  ?Heating pad  ?Warm bath  ?

## 2021-12-17 NOTE — Discharge Instructions (Signed)
Strep rapid test is negative, throat culture will be sent to laboratory.  These results will be available in 2 to 3 days on MyChart. ?COVID test is negative ?This is a viral respiratory infection.  You need rest, lots of fluids, Tylenol or ibuprofen for pain and fever ?May return to work and school when you have had no fever for 24 hours and your symptoms have improved ?Call for problems ?

## 2021-12-21 LAB — CULTURE, GROUP A STREP: Strep A Culture: NEGATIVE

## 2022-08-25 ENCOUNTER — Ambulatory Visit
Admission: EM | Admit: 2022-08-25 | Discharge: 2022-08-25 | Disposition: A | Discharge status: Home / Self Care | Payer: Medicaid Other | Attending: Family Medicine | Admitting: Family Medicine

## 2022-08-25 DIAGNOSIS — J209 Acute bronchitis, unspecified: Secondary | ICD-10-CM | POA: Diagnosis not present

## 2022-08-25 MED ORDER — DOXYCYCLINE HYCLATE 100 MG PO CAPS: ORAL_CAPSULE | ORAL | 0 refills | Status: AC

## 2022-08-25 NOTE — Discharge Instructions (Signed)
Take plain guaifenesin (1200mg  extended release tabs such as Mucinex) twice daily, with plenty of water, for cough and congestion. Get adequate rest.   May take Delsym Cough Suppressant ("12 Hour Cough Relief") at bedtime for nighttime cough.  Stop all antihistamines for now, and other non-prescription cough/cold preparations.

## 2022-08-25 NOTE — ED Triage Notes (Signed)
Pt c/o cold sxs x 1 week. Says he woke up this morning to a coughing fit and coughed up a small amount of blood.

## 2022-08-25 NOTE — ED Provider Notes (Signed)
[facility]    CSN: [id number] Arrival date & time: 08/25/22  1031      History   Chief Complaint Chief Complaint  Patient presents with   Cough    HPI Bradley Carter is a 22 y.o. male.   Patient developed cold-like symptoms about 1.5 weeks ago, now mostly improved except for persistent partly productive cough.  He denies shortness of breath and pleuritic pain.  However, yesterday he had a coughing "fit" and coughed up a small amount of blood.  He denies fevers, chills, and sweats.  The history is provided by the patient.    Past Medical History:  Diagnosis Date   Epilepsy (HCC)    GERD (gastroesophageal reflux disease)    as an infant and child   Seizures (HCC)    as child- resolved per pt's mother    Patient Active Problem List   Diagnosis Date Noted   ALLERGIC CONJUNCTIVITIS 11/03/2010   ALLERGIC RHINITIS 11/03/2010    Past Surgical History:  Procedure Laterality Date   MYRINGOTOMY WITH TUBE PLACEMENT     WISDOM TOOTH EXTRACTION  2022       Home Medications    Prior to Admission medications   Medication Sig Start Date End Date Taking? Authorizing Provider  doxycycline (VIBRAMYCIN) 100 MG capsule Take one cap PO Q12hr with food. 08/25/22  Yes Lattie Haw, MD  levETIRAcetam (KEPPRA) 100 MG/ML solution Continue Keppra 250 mg (2.14ml) twice daily for 3 days, then increase to 500 mg (5mL), twice daily for three days, then increase to 750 mg(7.17ml) twice daily for three days, then increase to 1000 mg (10mL), twice daily and continue 10/18/11  Yes [provider]  cetirizine (ZYRTEC) 10 MG tablet Take 1 tablet (10 mg total) by mouth daily. 07/05/18   Lurene Shadow, PA-C  fexofenadine (ALLEGRA ALLERGY) 180 MG tablet Take 1 tablet (180 mg total) by mouth daily for 15 days. 07/08/21 07/23/21  Trevor Iha, FNP    Family History Family History  Problem Relation Age of Onset   Healthy Mother    Hypertension Father     Social History Social  History   Tobacco Use   Smoking status: Never   Smokeless tobacco: Never  Vaping Use   Vaping Use: Never used  Substance Use Topics   Alcohol use: No   Drug use: No     Allergies   Penicillins   Review of Systems Review of Systems + sore throat, resolved + cough No pleuritic pain No wheezing + nasal congestion + post-nasal drainage No sinus pain/pressure No itchy/red eyes No earache + hemoptysis No SOB No fever No nausea No vomiting No abdominal pain No diarrhea No urinary symptoms No skin rash + fatigue No myalgias No headache   Physical Exam Triage Vital Signs ED Triage Vitals  Enc Vitals Group     BP 08/25/22 1040 138/88     Pulse Rate 08/25/22 1040 (!) 110     Resp 08/25/22 1040 18     Temp 08/25/22 1040 98.7 F (37.1 C)     Temp Source 08/25/22 1040 Oral     SpO2 08/25/22 1040 97 %     Weight --      Height --      Head Circumference --      Peak Flow --      Pain Score 08/25/22 1042 0     Pain Loc --      Pain Edu? --  Excl. in GC? --    No data found.  Updated Vital Signs BP 138/88 (BP Location: Left Arm)   Pulse (!) 110   Temp 98.7 F (37.1 C) (Oral)   Resp 18   SpO2 97%   Visual Acuity Right Eye Distance:   Left Eye Distance:   Bilateral Distance:    Right Eye Near:   Left Eye Near:    Bilateral Near:     Physical Exam Nursing notes and Vital Signs reviewed. Appearance:  Patient appears stated age, and in no acute distress Eyes:  Pupils are equal, round, and reactive to light and accomodation.  Extraocular movement is intact.  Conjunctivae are not inflamed  Ears:  Canals normal.  Tympanic membranes normal.  Nose:  Mildly congested turbinates.  No sinus tenderness.  Pharynx:  Normal Neck:  Supple.  Mildly enlarged lateral nodes are present, tender to palpation on the left.   Lungs:  Clear to auscultation.  Breath sounds are equal.  Moving air well. Heart:  Regular rate and rhythm without murmurs, rubs, or gallops.   Rate 110. Abdomen:  Nontender without masses or hepatosplenomegaly.  Bowel sounds are present.  No CVA or flank tenderness.  Extremities:  No edema.  Skin:  No rash present.   UC Treatments / Results  Labs (all labs ordered are listed, but only abnormal results are displayed) Labs Reviewed - No data to display  EKG   Radiology No results found.  Procedures Procedures (including critical care time)  Medications Ordered in UC Medications - No data to display  Initial Impression / Assessment and Plan / UC Course  I have reviewed the triage vital signs and the nursing notes.  Pertinent labs & imaging results that were available during my care of the patient were reviewed by me and considered in my medical decision making (see chart for details).    Benign exam.  Begin doxycycline. Followup with Family Doctor if not improved in one week.   Final Clinical Impressions(s) / UC Diagnoses   Final diagnoses:  Acute bronchitis, unspecified organism     Discharge Instructions      Take plain guaifenesin (1200mg  extended release tabs such as Mucinex) twice daily, with plenty of water, for cough and congestion. Get adequate rest.   May take Delsym Cough Suppressant ("12 Hour Cough Relief") at bedtime for nighttime cough.  Stop all antihistamines for now, and other non-prescription cough/cold preparations.        ED Prescriptions     Medication Sig Dispense Auth. Provider   doxycycline (VIBRAMYCIN) 100 MG capsule Take one cap PO Q12hr with food. 14 capsule Lattie Haw, MD         Lattie Haw, MD 08/27/22 [phone]
